# Patient Record
Sex: Female | Born: 1980 | Race: Black or African American | Hispanic: No | Marital: Single | Smoking: Current some day smoker
Health system: Southern US, Community
[De-identification: ages and names within clinical notes are randomized; demographics above are authoritative.]

## PROBLEM LIST (undated history)

## (undated) DIAGNOSIS — C8590 Non-Hodgkin lymphoma, unspecified, unspecified site: Secondary | ICD-10-CM

---

## 2008-10-05 ENCOUNTER — Emergency Department: Payer: Self-pay | Admitting: Emergency Medicine

## 2008-11-13 ENCOUNTER — Emergency Department: Payer: Self-pay | Admitting: Emergency Medicine

## 2019-02-26 ENCOUNTER — Other Ambulatory Visit: Payer: Self-pay

## 2019-06-22 ENCOUNTER — Emergency Department
Admission: EM | Admit: 2019-06-22 | Discharge: 2019-06-22 | Disposition: A | Discharge status: Home / Self Care | Payer: Self-pay | Attending: Emergency Medicine | Admitting: Emergency Medicine

## 2019-06-22 ENCOUNTER — Other Ambulatory Visit: Payer: Self-pay

## 2019-06-22 ENCOUNTER — Emergency Department: Payer: Self-pay

## 2019-06-22 DIAGNOSIS — Y929 Unspecified place or not applicable: Secondary | ICD-10-CM | POA: Insufficient documentation

## 2019-06-22 DIAGNOSIS — M25561 Pain in right knee: Secondary | ICD-10-CM | POA: Insufficient documentation

## 2019-06-22 DIAGNOSIS — X500XXA Overexertion from strenuous movement or load, initial encounter: Secondary | ICD-10-CM | POA: Insufficient documentation

## 2019-06-22 DIAGNOSIS — Y9383 Activity, rough housing and horseplay: Secondary | ICD-10-CM | POA: Insufficient documentation

## 2019-06-22 DIAGNOSIS — Y999 Unspecified external cause status: Secondary | ICD-10-CM | POA: Insufficient documentation

## 2019-06-22 DIAGNOSIS — F17210 Nicotine dependence, cigarettes, uncomplicated: Secondary | ICD-10-CM | POA: Insufficient documentation

## 2019-06-22 HISTORY — DX: Non-Hodgkin lymphoma, unspecified, unspecified site: C85.90

## 2019-06-22 MED ORDER — HYDROCODONE-ACETAMINOPHEN 5-325 MG PO TABS: 1.0000 | ORAL_TABLET | Freq: Four times a day (QID) | ORAL | 0 refills | Status: AC | PRN

## 2019-06-22 MED ORDER — MELOXICAM 15 MG PO TABS: 15.0000 mg | ORAL_TABLET | Freq: Every day | ORAL | 2 refills | Status: AC
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
## 2019-06-22 NOTE — ED Triage Notes (Signed)
Pt to ED via POV from home. Pt stating yesterday she was playing with her kids and heard her left knee pop when she pivoted. Pt stating pain 8/10. Swelling noted on medial aspect of left knee.

## 2019-06-22 NOTE — Discharge Instructions (Addendum)
Follow-up with [facility] clinic orthopedics.  Please call for an appointment.  Wear the knee immobilizer to calm down the inflammation in the knee.  Apply ice.  Weightbearing as tolerated.  Return if worsening.

## 2019-08-09 ENCOUNTER — Encounter: Payer: Self-pay | Admitting: Emergency Medicine

## 2019-08-09 ENCOUNTER — Emergency Department
Admission: EM | Admit: 2019-08-09 | Discharge: 2019-08-09 | Disposition: A | Discharge status: Home / Self Care | Payer: Self-pay | Attending: Emergency Medicine | Admitting: Emergency Medicine

## 2019-08-09 ENCOUNTER — Other Ambulatory Visit: Payer: Self-pay

## 2019-08-09 DIAGNOSIS — F17210 Nicotine dependence, cigarettes, uncomplicated: Secondary | ICD-10-CM | POA: Insufficient documentation

## 2019-08-09 DIAGNOSIS — G43909 Migraine, unspecified, not intractable, without status migrainosus: Secondary | ICD-10-CM | POA: Insufficient documentation

## 2019-08-09 DIAGNOSIS — M7918 Myalgia, other site: Secondary | ICD-10-CM | POA: Insufficient documentation

## 2019-08-09 MED ORDER — METOCLOPRAMIDE HCL 10 MG PO TABS
10.0000 mg | ORAL_TABLET | Freq: Once | ORAL | Status: AC
  Administered 2019-08-09: 10 mg via ORAL
  Filled 2019-08-09: qty 1

## 2019-08-09 MED ORDER — DIPHENHYDRAMINE HCL 25 MG PO CAPS
25.0000 mg | ORAL_CAPSULE | Freq: Once | ORAL | Status: AC
  Administered 2019-08-09: 16:00:00 25 mg via ORAL
  Filled 2019-08-09: qty 1

## 2019-08-09 MED ORDER — KETOROLAC TROMETHAMINE 10 MG PO TABS: 10.0000 mg | ORAL_TABLET | Freq: Four times a day (QID) | ORAL | 0 refills | Status: AC | PRN

## 2019-08-09 MED ORDER — KETOROLAC TROMETHAMINE 30 MG/ML IJ SOLN
30.0000 mg | Freq: Once | INTRAMUSCULAR | Status: AC
  Administered 2019-08-09: 30 mg via INTRAMUSCULAR
  Filled 2019-08-09: qty 1

## 2019-08-09 NOTE — ED Provider Notes (Signed)
Emergency Department Provider Note  ____________________________________________  Time seen: Approximately 3:16 PM  I have reviewed the triage vital signs and the nursing notes.   HISTORY  Chief Complaint Headache   Historian Patient    HPI Rita Thompson is a 39 y.o. female with a history of lymphoma currently in remission, presents to the emergency department with occipital headache for approximately 1 week after obtaining a Pfizer vaccine.  Patient states that she is also had some chills and body aches.  She states that she has had to call at work due to her symptoms.  She denies rhinorrhea, nasal congestion or nonproductive cough.  She denies a history of headaches or possibility of pregnancy.  She denies changes in vision.  She reports that headache came on slowly right after the COVID-19 vaccine and has progressed in intensity.  No weakness in the upper and lower extremities.  No pain with movement of the neck.   Past Medical History:  Diagnosis Date  . Lymphoma ([place])      Immunizations up to date:  Yes.     Past Medical History:  Diagnosis Date  . Lymphoma ([place])     There are no problems to display for this patient.   History reviewed. No pertinent surgical history.  Prior to Admission medications   Medication Sig Start Date End Date Taking? Authorizing Provider  HYDROcodone-acetaminophen (NORCO/VICODIN) 5-325 MG tablet Take 1 tablet by mouth every 6 (six) hours as needed for moderate pain. 06/22/19   Caryn Section Linden Dolin, PA-C  ketorolac (TORADOL) 10 MG tablet Take 1 tablet (10 mg total) by mouth every 6 (six) hours as needed for up to 5 days. 08/09/19 08/14/19  Lannie Fields, PA-C  meloxicam (MOBIC) 15 MG tablet Take 1 tablet (15 mg total) by mouth daily. 06/22/19 06/21/20  Versie Starks, PA-C    Allergies Patient has no known allergies.  No family history on file.  Social History Social History   Tobacco Use  . Smoking status: Current Some Day Smoker     Types: Cigarettes  . Smokeless tobacco: Never Used  Substance Use Topics  . Alcohol use: Yes    Alcohol/week: 2.0 standard drinks    Types: 2 Glasses of wine per week  . Drug use: Never     Review of Systems  Constitutional: No fever/chills Eyes:  No discharge ENT: No upper respiratory complaints. Respiratory: no cough. No SOB/ use of accessory muscles to breath Gastrointestinal:   No nausea, no vomiting.  No diarrhea.  No constipation. Musculoskeletal: Negative for musculoskeletal pain. Neuro: Patient has headache. Skin: Negative for rash, abrasions, lacerations, ecchymosis.    ____________________________________________   PHYSICAL EXAM:  VITAL SIGNS: ED Triage Vitals  Enc Vitals Group     BP 08/09/19 1421 122/75     Pulse Rate 08/09/19 1421 94     Resp 08/09/19 1421 16     Temp 08/09/19 1421 98.8 F (37.1 C)     Temp Source 08/09/19 1421 Oral     SpO2 08/09/19 1421 99 %     Weight 08/09/19 1419 214 lb 15.2 oz (97.5 kg)     Height 08/09/19 1419 5\' 6"  (1.676 m)     Head Circumference --      Peak Flow --      Pain Score 08/09/19 1419 8     Pain Loc --      Pain Edu? --      Excl. in [place]? --  Constitutional: Alert and oriented. Well appearing and in no acute distress. Eyes: Conjunctivae are normal. PERRL. EOMI. Head: Atraumatic. ENT:      Ears: TMs are pearly.       Nose: No congestion/rhinnorhea.      Mouth/Throat: Mucous membranes are moist.  Neck: No stridor.  No cervical spine tenderness to palpation. Cardiovascular: Normal rate, regular rhythm. Normal S1 and S2.  Good peripheral circulation. Respiratory: Normal respiratory effort without tachypnea or retractions. Lungs CTAB. Good air entry to the bases with no decreased or absent breath sounds Gastrointestinal: Bowel sounds x 4 quadrants. Soft and nontender to palpation. No guarding or rigidity. No distention. Musculoskeletal: Full range of motion to all extremities. No obvious deformities  noted Neurologic:  Normal for age. No gross focal neurologic deficits are appreciated.  Skin:  Skin is warm, dry and intact. No rash noted. Psychiatric: Mood and affect are normal for age. Speech and behavior are normal.   ____________________________________________   LABS (all labs ordered are listed, but only abnormal results are displayed)  Labs Reviewed - No data to display ____________________________________________  EKG   ____________________________________________  RADIOLOGY   No results found.  ____________________________________________    PROCEDURES  Procedure(s) performed:     Procedures     Medications  ketorolac (TORADOL) 30 MG/ML injection 30 mg (30 mg Intramuscular Given 08/09/19 1620)  metoCLOPramide (REGLAN) tablet 10 mg (10 mg Oral Given 08/09/19 1620)  diphenhydrAMINE (BENADRYL) capsule 25 mg (25 mg Oral Given 08/09/19 1620)     ____________________________________________   INITIAL IMPRESSION / ASSESSMENT AND PLAN / ED COURSE  Pertinent labs & imaging results that were available during my care of the patient were reviewed by me and considered in my medical decision making (see chart for details).      Assessment and plan Headache 38 year old female presents to the emergency department with frontal headache for 1 week after receiving the COVID-19 vaccine.  Neuro exam was reassuring without acute deficits.  Will administer Reglan, Benadryl and Toradol and will reevaluate.  Patient states that her headache improved significantly after aforementioned medications were administered.  Patient was discharged with Toradol to be used if headache returns.  Rest and hydration were encouraged.  A work note was provided.  All patient questions were answered. ____________________________________________  FINAL CLINICAL IMPRESSION(S) / ED DIAGNOSES  Final diagnoses:  Migraine without status migrainosus, not intractable, unspecified migraine  type      NEW MEDICATIONS STARTED DURING THIS VISIT:  ED Discharge Orders         Ordered    ketorolac (TORADOL) 10 MG tablet  Every 6 hours PRN     08/09/19 1701              This chart was dictated using voice recognition software/Dragon. Despite best efforts to proofread, errors can occur which can change the meaning. Any change was purely unintentional.     Lannie Fields, PA-C 08/09/19 1706    Nance Pear, MD 08/09/19 [phone]

## 2019-08-09 NOTE — ED Triage Notes (Signed)
C/O headache to forehead/ sinus headache x 1 week.  C/O pressure to eyes and nose and sinuses.  States symptoms started last week after first COVID vaccine.  AAOx3.  Skin warm and dry.  NAD

## 2019-11-09 ENCOUNTER — Other Ambulatory Visit: Payer: Self-pay

## 2019-11-09 ENCOUNTER — Encounter: Payer: Self-pay | Admitting: Intensive Care

## 2019-11-09 ENCOUNTER — Emergency Department
Admission: EM | Admit: 2019-11-09 | Discharge: 2019-11-09 | Disposition: A | Discharge status: Home / Self Care | Payer: Self-pay | Attending: Emergency Medicine | Admitting: Emergency Medicine

## 2019-11-09 DIAGNOSIS — K2900 Acute gastritis without bleeding: Secondary | ICD-10-CM | POA: Insufficient documentation

## 2019-11-09 DIAGNOSIS — Z79899 Other long term (current) drug therapy: Secondary | ICD-10-CM | POA: Insufficient documentation

## 2019-11-09 DIAGNOSIS — F17210 Nicotine dependence, cigarettes, uncomplicated: Secondary | ICD-10-CM | POA: Insufficient documentation

## 2019-11-09 LAB — COMPREHENSIVE METABOLIC PANEL
ALT: 9 U/L (ref 0–44)
AST: 15 U/L (ref 15–41)
Albumin: 4.4 g/dL (ref 3.5–5.0)
Alkaline Phosphatase: 57 U/L (ref 38–126)
Anion gap: 9 (ref 5–15)
BUN: 6 mg/dL (ref 6–20)
CO2: 24 mmol/L (ref 22–32)
Calcium: 8.7 mg/dL — ABNORMAL LOW (ref 8.9–10.3)
Chloride: 106 mmol/L (ref 98–111)
Creatinine, Ser: 0.59 mg/dL (ref 0.44–1.00)
GFR calc Af Amer: >60 mL/min (ref >60)
GFR calc non Af Amer: >60 mL/min (ref >60)
Glucose, Bld: 97 mg/dL (ref 70–99)
Potassium: 3.4 mmol/L — ABNORMAL LOW (ref 3.5–5.1)
Sodium: 139 mmol/L (ref 135–145)
Total Bilirubin: 0.8 mg/dL (ref 0.3–1.2)
Total Protein: 7.3 g/dL (ref 6.5–8.1)

## 2019-11-09 LAB — CBC WITH DIFFERENTIAL/PLATELET
Abs Immature Granulocytes: 0.02 10*3/uL (ref 0.00–0.07)
Basophils Absolute: 0 10*3/uL (ref 0.0–0.1)
Basophils Relative: 0 %
Eosinophils Absolute: 0.1 10*3/uL (ref 0.0–0.5)
Eosinophils Relative: 1 %
HCT: 37.1 % (ref 36.0–46.0)
Hemoglobin: 12.4 g/dL (ref 12.0–15.0)
Immature Granulocytes: 0 %
Lymphocytes Relative: 30 %
Lymphs Abs: 2.1 10*3/uL (ref 0.7–4.0)
MCH: 30.5 pg (ref 26.0–34.0)
MCHC: 33.4 g/dL (ref 30.0–36.0)
MCV: 91.2 fL (ref 80.0–100.0)
Monocytes Absolute: 0.6 10*3/uL (ref 0.1–1.0)
Monocytes Relative: 9 %
Neutro Abs: 4.2 10*3/uL (ref 1.7–7.7)
Neutrophils Relative %: 60 %
Platelets: 366 10*3/uL (ref 150–400)
RBC: 4.07 MIL/uL (ref 3.87–5.11)
RDW: 13.1 % (ref 11.5–15.5)
WBC: 7 10*3/uL (ref 4.0–10.5)
nRBC: 0 % (ref 0.0–0.2)

## 2019-11-09 LAB — PROTIME-INR
INR: 1.1 (ref 0.8–1.2)
Prothrombin Time: 13.9 seconds (ref 11.4–15.2)

## 2019-11-09 LAB — LIPASE, BLOOD: Lipase: 23 U/L (ref 11–51)

## 2019-11-09 MED ORDER — FAMOTIDINE 20 MG PO TABS
40.0000 mg | ORAL_TABLET | Freq: Once | ORAL | Status: AC
  Administered 2019-11-09: 40 mg via ORAL
  Filled 2019-11-09: qty 2

## 2019-11-09 MED ORDER — FAMOTIDINE 20 MG PO TABS: 20.0000 mg | ORAL_TABLET | Freq: Two times a day (BID) | ORAL | 0 refills | Status: AC

## 2019-11-09 MED ORDER — METOCLOPRAMIDE HCL 10 MG PO TABS: 10.0000 mg | ORAL_TABLET | Freq: Four times a day (QID) | ORAL | 0 refills | Status: AC | PRN

## 2019-11-09 MED ORDER — METOCLOPRAMIDE HCL 10 MG PO TABS
10.0000 mg | ORAL_TABLET | Freq: Once | ORAL | Status: AC
  Administered 2019-11-09: 10 mg via ORAL
  Filled 2019-11-09: qty 1

## 2019-11-09 MED ORDER — ONDANSETRON HCL 4 MG/2ML IJ SOLN
4.0000 mg | Freq: Once | INTRAMUSCULAR | Status: AC
  Administered 2019-11-09: 4 mg via INTRAVENOUS
  Filled 2019-11-09: qty 2

## 2019-11-09 NOTE — ED Notes (Signed)
Redraw of light green and blue top sent to lab at this time.

## 2019-11-09 NOTE — ED Notes (Signed)
Pt provided water and graham crackers at this time.

## 2019-11-09 NOTE — ED Triage Notes (Signed)
Pt c/o abd pain and black stools that started early this AM. N/D present

## 2019-11-09 NOTE — Discharge Instructions (Signed)
Your blood test today were all normal, and there were no signs of GI bleeding or anemia.  Please cut back on ibuprofen use and try using famotidine and metoclopramide to control stomach inflammation and headaches.

## 2019-11-09 NOTE — ED Provider Notes (Signed)
[facility] Emergency Department Provider Note  ____________________________________________  Time seen: Approximately 8:28 AM  I have reviewed the triage vital signs and the nursing notes.   HISTORY  Chief Complaint Abdominal Pain    HPI Rita Thompson is a 39 y.o. female with a past history of lymphoma status post chemotherapy and IBS who comes the ED complaining of upper abdominal pain and nausea associated with black tarry stool that started this morning.  She was in her usual state of health last night at bedtime, woke up at about 7:00 this morning noticing upper abdominal cramping which is nonradiating without aggravating alleviating factors.  She went to the bathroom and had a bowel movement which was subsequently followed by large amount of black runny stool.  Denies lightheadedness chest pain shortness of breath or fever.  She does report that she takes ibuprofen 800 mg every night for headache, sometimes twice a day.  A few months ago she took Toradol for knee pain as well.   Denies blood thinner use.  No trauma.   Past Medical History:  Diagnosis Date  . Lymphoma ([place])      There are no problems to display for this patient.    History reviewed. No pertinent surgical history.   Prior to Admission medications   Medication Sig Start Date End Date Taking? Authorizing Provider  famotidine (PEPCID) 20 MG tablet Take 1 tablet (20 mg total) by mouth 2 (two) times daily. 11/09/19   Carrie Mew, MD  HYDROcodone-acetaminophen (NORCO/VICODIN) 5-325 MG tablet Take 1 tablet by mouth every 6 (six) hours as needed for moderate pain. 06/22/19   Fisher, Linden Dolin, PA-C  meloxicam (MOBIC) 15 MG tablet Take 1 tablet (15 mg total) by mouth daily. 06/22/19 06/21/20  Versie Starks, PA-C  metoCLOPramide (REGLAN) 10 MG tablet Take 1 tablet (10 mg total) by mouth every 6 (six) hours as needed. 11/09/19   Carrie Mew, MD     Allergies Patient has no known  allergies.   History reviewed. No pertinent family history.  Social History Social History   Tobacco Use  . Smoking status: Current Some Day Smoker    Types: Cigarettes  . Smokeless tobacco: Never Used  Substance Use Topics  . Alcohol use: Yes    Alcohol/week: 2.0 standard drinks    Types: 2 Glasses of wine per week  . Drug use: Never    Review of Systems  Constitutional:   No fever or chills.  ENT:   No sore throat. No rhinorrhea. Cardiovascular:   No chest pain or syncope. Respiratory:   No dyspnea or cough. Gastrointestinal:   Positive as above for abdominal pain and diarrhea.  Musculoskeletal:   Negative for focal pain or swelling All other systems reviewed and are negative except as documented above in ROS and HPI.  ____________________________________________   PHYSICAL EXAM:  VITAL SIGNS: ED Triage Vitals  Enc Vitals Group     BP 11/09/19 0819 (!) 141/85     Pulse Rate 11/09/19 0819 71     Resp 11/09/19 0819 16     Temp 11/09/19 0819 98.5 F (36.9 C)     Temp Source 11/09/19 0819 Oral     SpO2 11/09/19 0819 99 %     Weight 11/09/19 0818 240 lb (108.9 kg)     Height 11/09/19 0818 5\' 6"  (1.676 m)     Head Circumference --      Peak Flow --      Pain Score 11/09/19  0818 4     Pain Loc --      Pain Edu? --      Excl. in [place]? --     Vital signs reviewed, nursing assessments reviewed.   Constitutional:   Alert and oriented. Non-toxic appearance. Eyes:   Conjunctivae are normal. EOMI. PERRL. ENT      Head:   Normocephalic and atraumatic.      Nose:   Wearing a mask.      Mouth/Throat:   Wearing a mask.      Neck:   No meningismus. Full ROM. Hematological/Lymphatic/Immunilogical:   No cervical lymphadenopathy. Cardiovascular:   RRR. Symmetric bilateral radial and DP pulses.  No murmurs. Cap refill less than 2 seconds. Respiratory:   Normal respiratory effort without tachypnea/retractions. Breath sounds are clear and equal bilaterally. No  wheezes/rales/rhonchi. Gastrointestinal:   Soft with epigastric and left upper quadrant tenderness.  Non distended. There is no CVA tenderness.  No rebound, rigidity, or guarding.  Musculoskeletal:   Normal range of motion in all extremities. No joint effusions.  No lower extremity tenderness.  No edema. Neurologic:   Normal speech and language.  Motor grossly intact. No acute focal neurologic deficits are appreciated.  Skin:    Skin is warm, dry and intact. No rash noted.  No petechiae, purpura, or bullae.  ____________________________________________    LABS (pertinent positives/negatives) (all labs ordered are listed, but only abnormal results are displayed) Labs Reviewed  COMPREHENSIVE METABOLIC PANEL - Abnormal; Notable for the following components:      Result Value   Potassium 3.4 (*)    Calcium 8.7 (*)    All other components within normal limits  CBC WITH DIFFERENTIAL/PLATELET  LIPASE, BLOOD  URINALYSIS, COMPLETE (UACMP) WITH MICROSCOPIC  PROTIME-INR  POC URINE PREG, ED   ____________________________________________   EKG    ____________________________________________    RADIOLOGY  No results found.  ____________________________________________   PROCEDURES Procedures  ____________________________________________  DIFFERENTIAL DIAGNOSIS   Peptic ulcer disease, AVM, gastritis, anemia, pancreatitis, upper GI bleed  CLINICAL IMPRESSION / ASSESSMENT AND PLAN / ED COURSE  Medications ordered in the ED: Medications  ondansetron (ZOFRAN) injection 4 mg (4 mg Intravenous Given 11/09/19 0839)  metoCLOPramide (REGLAN) tablet 10 mg (10 mg Oral Given 11/09/19 0928)  famotidine (PEPCID) tablet 40 mg (40 mg Oral Given 11/09/19 2979)    Pertinent labs & imaging results that were available during my care of the patient were reviewed by me and considered in my medical decision making (see chart for details).  Rita Thompson was evaluated in Emergency  Department on 11/09/2019 for the symptoms described in the history of present illness. She was evaluated in the context of the global COVID-19 pandemic, which necessitated consideration that the patient might be at risk for infection with the SARS-CoV-2 virus that causes COVID-19. Institutional protocols and algorithms that pertain to the evaluation of patients at risk for COVID-19 are in a state of rapid change based on information released by regulatory bodies including the CDC and federal and state organizations. These policies and algorithms were followed during the patient's care in the ED.   Patient presents with upper abdominal pain and tenderness, report of black tarry stool.  Will check Hemoccult, labs.  Vital signs are normal on arrival, no signs or symptoms of hemorrhagic shock.  Clinical Course as of Nov 09 938  Sat Nov 09, 2019  0920 Rectal exam performed with nurse Anda Kraft at bedside.  Scant brown stool, Hemoccult negative, control is okay.  CBC is normal.  Counseled patient to wean off of NSAIDs, will try Reglan and Pepcid for headache, nausea and likely GERD control.   [PS]    Clinical Course User Index [PS] Carrie Mew, MD    ----------------------------------------- 9:41 AM on 11/09/2019 -----------------------------------------  Chemistry panel normal, stable for discharge without any signs of GI bleed or acute blood loss.  Suspect chronic daily headache due to NSAID overuse and a degree of GERD/gastritis.   ____________________________________________   FINAL CLINICAL IMPRESSION(S) / ED DIAGNOSES    Final diagnoses:  Acute gastritis without hemorrhage, unspecified gastritis type     ED Discharge Orders         Ordered    metoCLOPramide (REGLAN) 10 MG tablet  Every 6 hours PRN     Discontinue  Reprint     11/09/19 0939    famotidine (PEPCID) 20 MG tablet  2 times daily     Discontinue  Reprint     11/09/19 0939          Portions of this note were  generated with dragon dictation software. Dictation errors may occur despite best attempts at proofreading.   Carrie Mew, MD 11/09/19 [phone]

## 2021-05-19 ENCOUNTER — Ambulatory Visit: Payer: Self-pay

## 2022-01-06 IMAGING — DX DG KNEE COMPLETE 4+V*L*
4 series · 4 of 4 positions shown · non-contrast
Comparison: 10/05/2008

CLINICAL DATA: Pain and swelling, popping sensation

EXAM:
LEFT KNEE - COMPLETE 4+ VIEW

[knee ap]
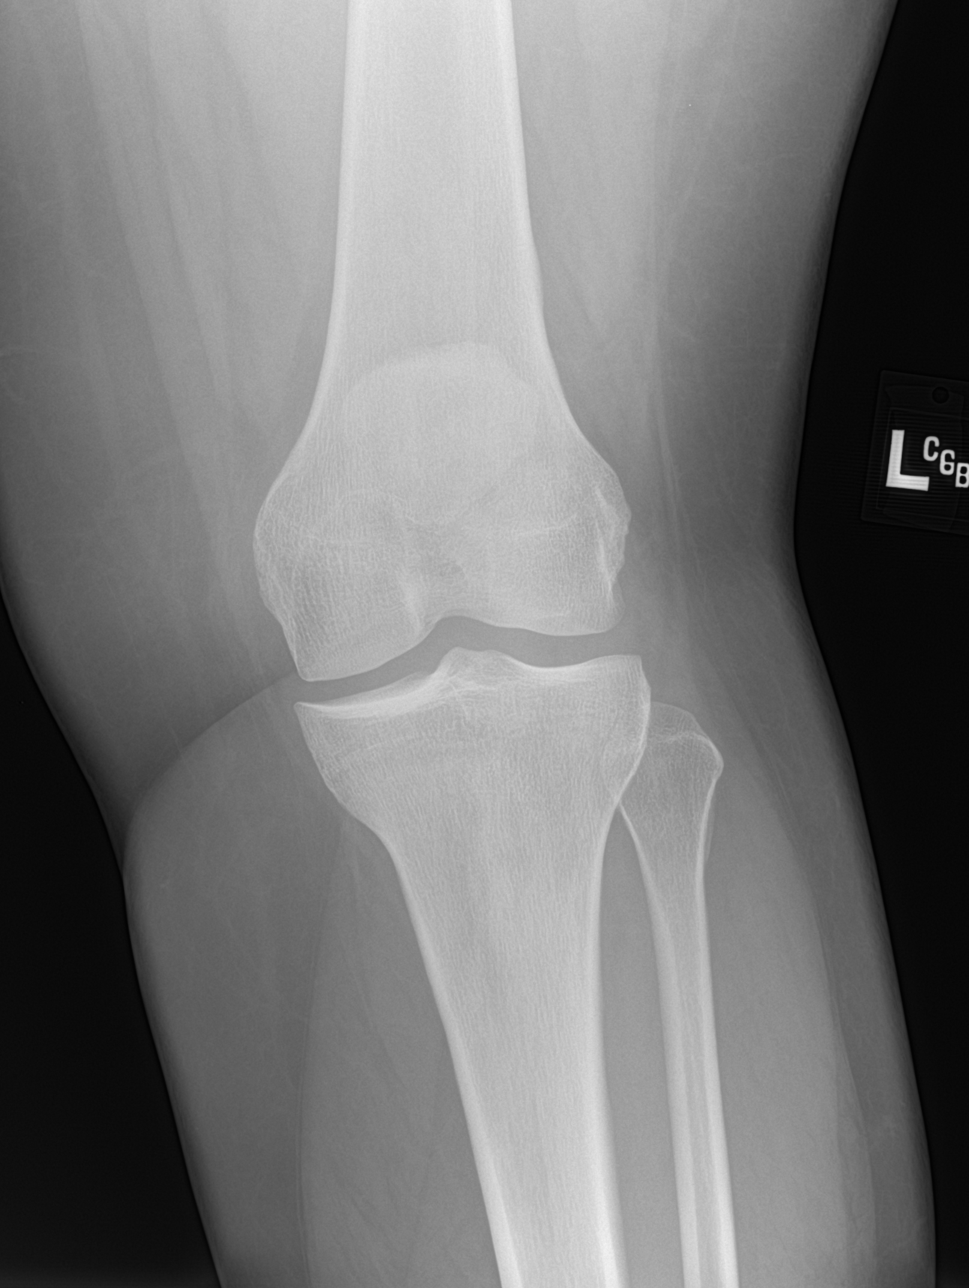

[knee obl (1 of 2)]
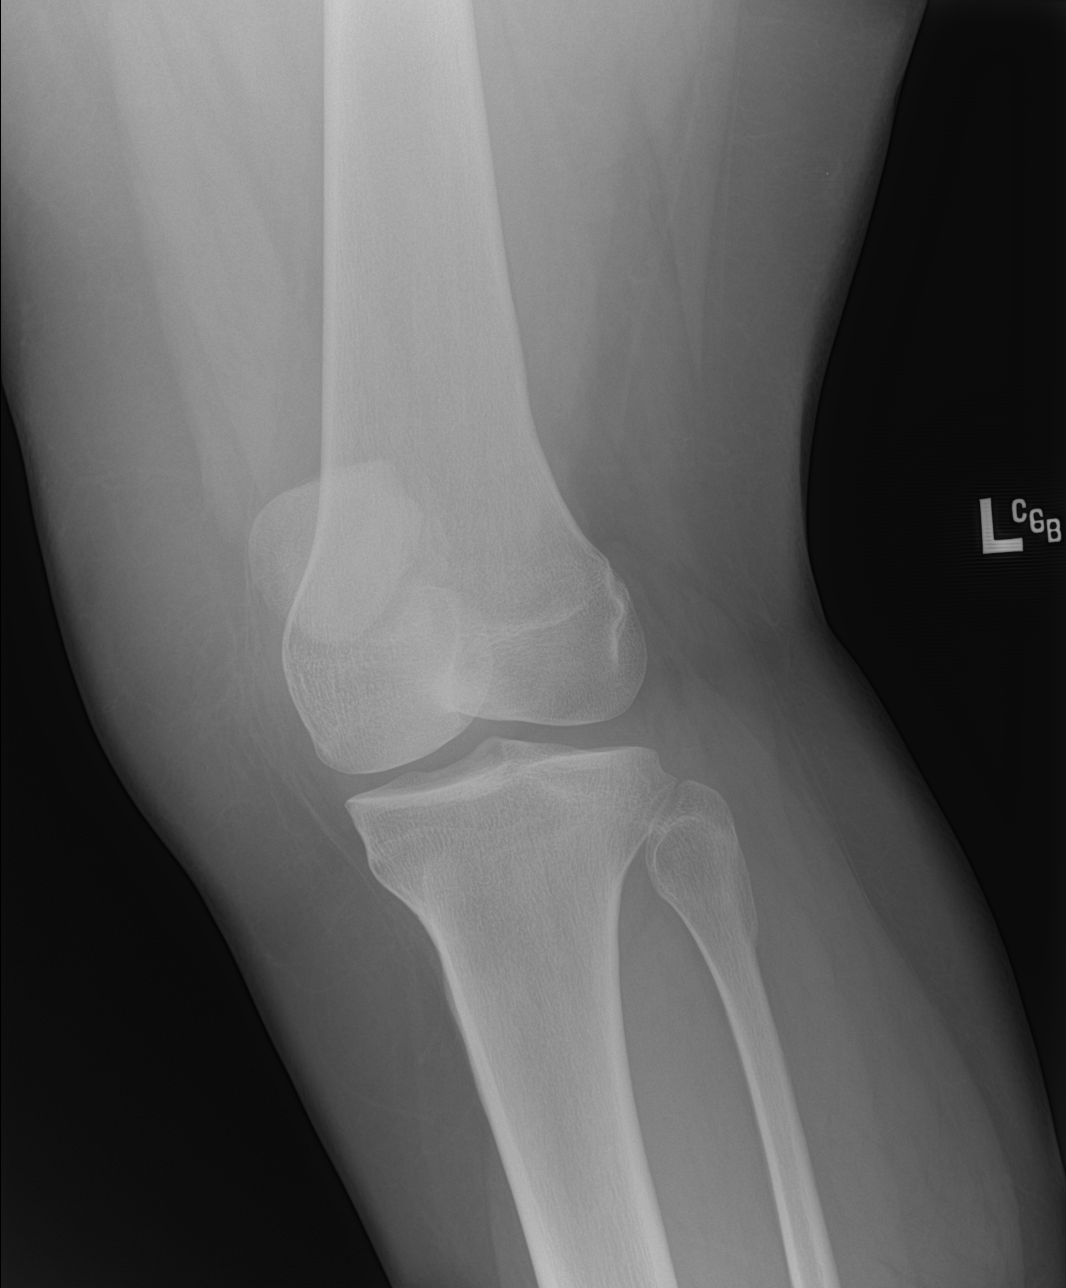

[knee lat]
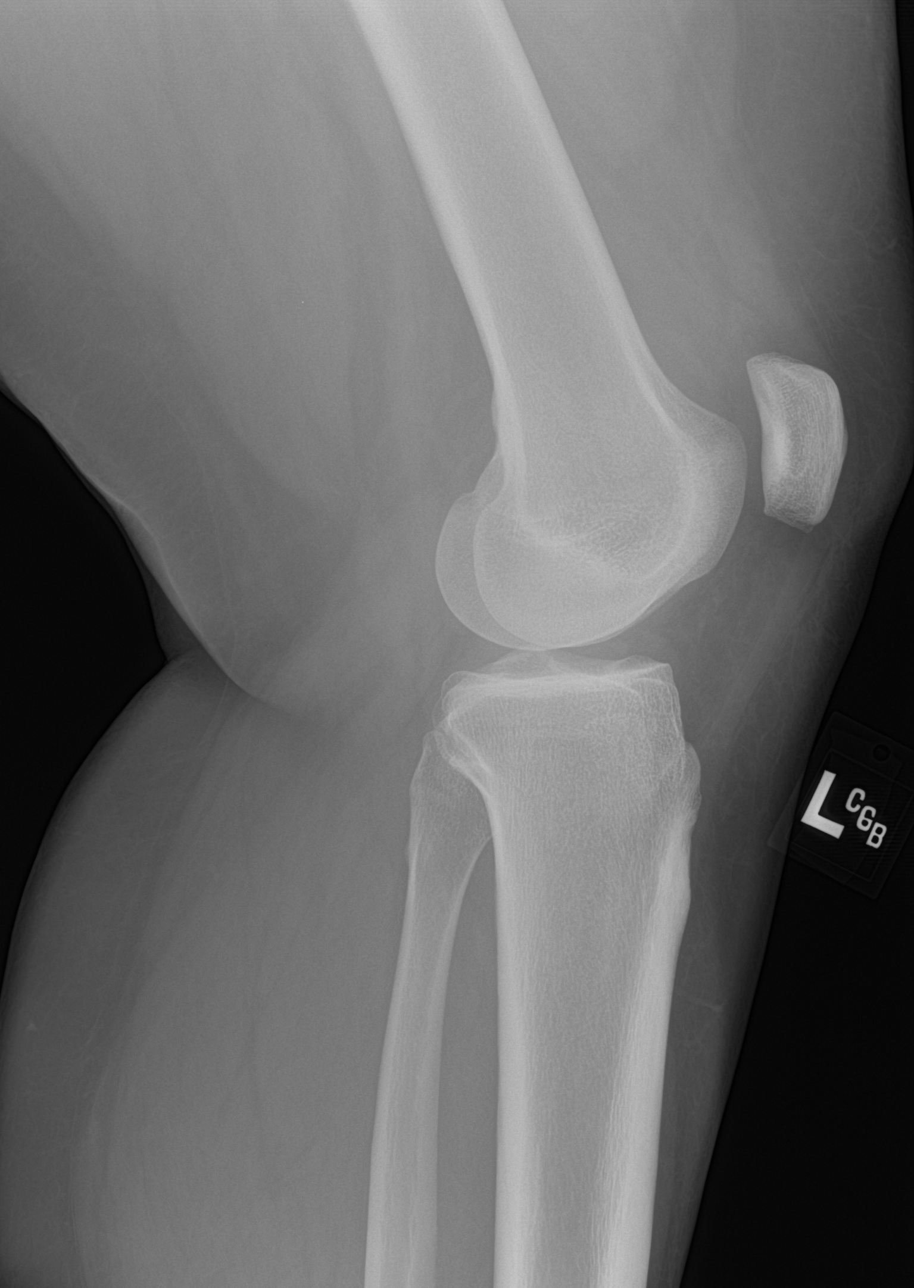

[knee obl (2 of 2)]
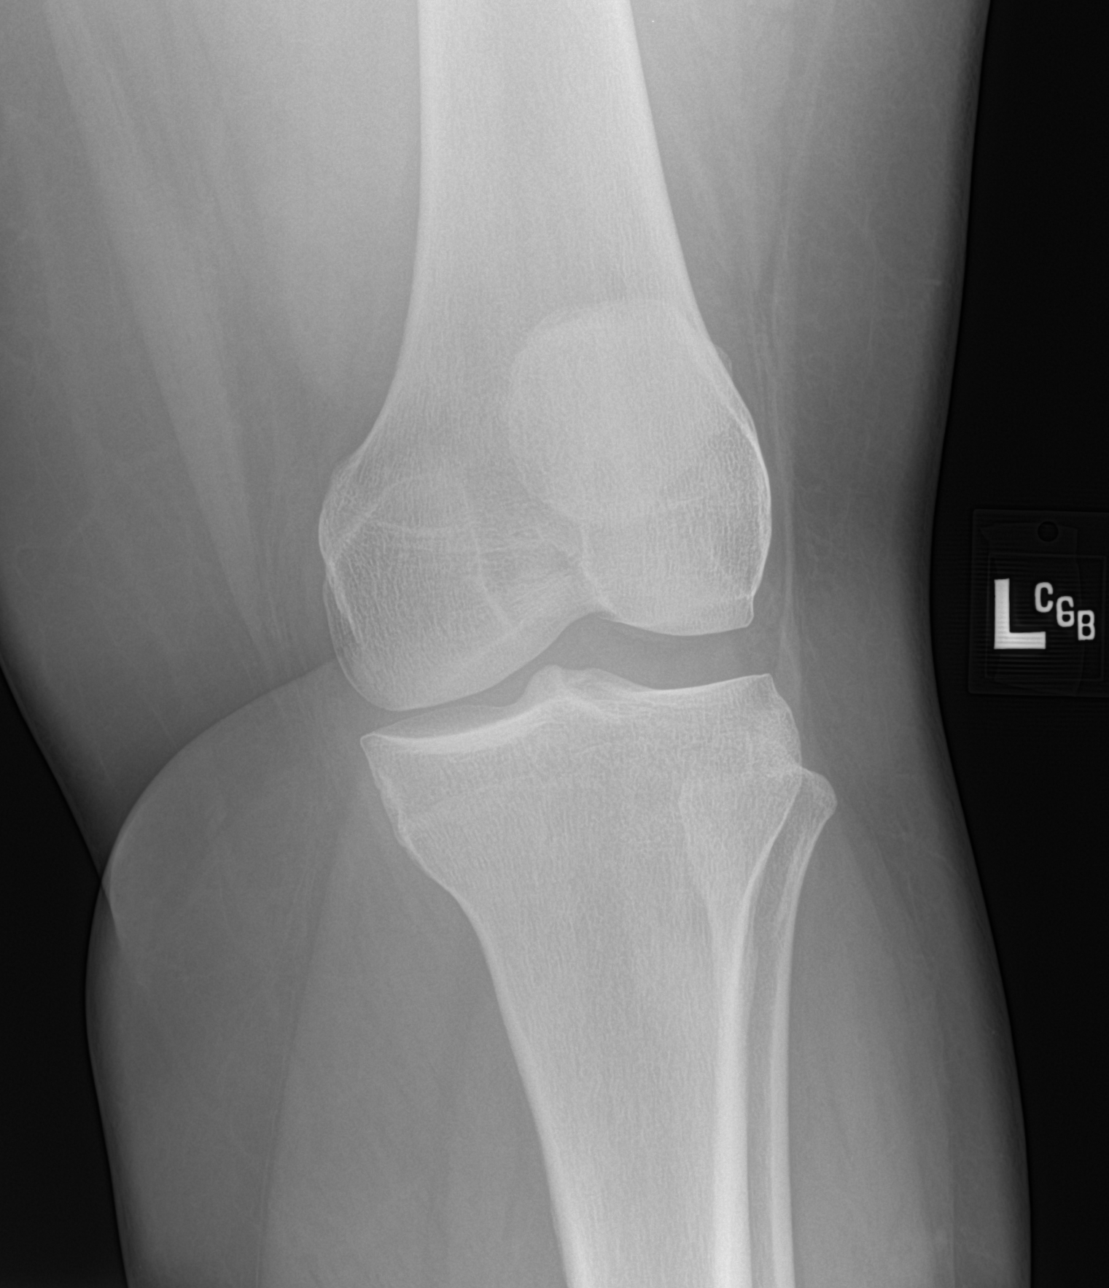

[4 of 4 positions shown; findings below may reference images not displayed]

FINDINGS: No fracture or dislocation of the left knee. Joint spaces are
preserved. There may be a knee joint effusion. Soft tissues are
unremarkable.
IMPRESSION: No fracture or dislocation of the left knee. Joint spaces are
preserved. There may be a knee joint effusion.

## 2022-05-27 DIAGNOSIS — H109 Unspecified conjunctivitis: Secondary | ICD-10-CM | POA: Diagnosis not present

## 2022-05-27 DIAGNOSIS — F17210 Nicotine dependence, cigarettes, uncomplicated: Secondary | ICD-10-CM | POA: Diagnosis not present

## 2022-05-27 DIAGNOSIS — D3192 Benign neoplasm of unspecified part of left eye: Secondary | ICD-10-CM | POA: Diagnosis not present

## 2022-11-15 DIAGNOSIS — M25512 Pain in left shoulder: Secondary | ICD-10-CM | POA: Diagnosis not present

## 2022-11-15 DIAGNOSIS — S4992XA Unspecified injury of left shoulder and upper arm, initial encounter: Secondary | ICD-10-CM | POA: Diagnosis not present

## 2022-11-15 DIAGNOSIS — M24512 Contracture, left shoulder: Secondary | ICD-10-CM | POA: Diagnosis not present

## 2022-11-15 DIAGNOSIS — Z91048 Other nonmedicinal substance allergy status: Secondary | ICD-10-CM | POA: Diagnosis not present

## 2022-11-15 DIAGNOSIS — F17210 Nicotine dependence, cigarettes, uncomplicated: Secondary | ICD-10-CM | POA: Diagnosis not present

## 2023-01-24 DIAGNOSIS — M67814 Other specified disorders of tendon, left shoulder: Secondary | ICD-10-CM | POA: Diagnosis not present

## 2023-01-24 DIAGNOSIS — M25512 Pain in left shoulder: Secondary | ICD-10-CM | POA: Diagnosis not present

## 2023-01-24 DIAGNOSIS — G8929 Other chronic pain: Secondary | ICD-10-CM | POA: Diagnosis not present

## 2023-01-24 DIAGNOSIS — M25511 Pain in right shoulder: Secondary | ICD-10-CM | POA: Diagnosis not present

## 2023-01-24 DIAGNOSIS — R202 Paresthesia of skin: Secondary | ICD-10-CM | POA: Diagnosis not present

## 2023-01-24 DIAGNOSIS — M67813 Other specified disorders of tendon, right shoulder: Secondary | ICD-10-CM | POA: Diagnosis not present

## 2023-01-24 DIAGNOSIS — R29898 Other symptoms and signs involving the musculoskeletal system: Secondary | ICD-10-CM | POA: Diagnosis not present

## 2023-01-24 DIAGNOSIS — R200 Anesthesia of skin: Secondary | ICD-10-CM | POA: Diagnosis not present

## 2023-03-07 DIAGNOSIS — R070 Pain in throat: Secondary | ICD-10-CM | POA: Diagnosis not present

## 2023-03-07 DIAGNOSIS — R591 Generalized enlarged lymph nodes: Secondary | ICD-10-CM | POA: Diagnosis not present

## 2023-05-30 DIAGNOSIS — M25511 Pain in right shoulder: Secondary | ICD-10-CM | POA: Diagnosis not present

## 2023-05-30 DIAGNOSIS — M67813 Other specified disorders of tendon, right shoulder: Secondary | ICD-10-CM | POA: Diagnosis not present

## 2023-05-30 DIAGNOSIS — M25512 Pain in left shoulder: Secondary | ICD-10-CM | POA: Diagnosis not present

## 2023-05-30 DIAGNOSIS — M67814 Other specified disorders of tendon, left shoulder: Secondary | ICD-10-CM | POA: Diagnosis not present
